# Patient Record
Sex: Female | Born: 1960 | Race: Black or African American | Hispanic: No | Marital: Married | Smoking: Never smoker
Health system: Southern US, Community
[De-identification: ages and names within clinical notes are randomized; demographics above are authoritative.]

## PROBLEM LIST (undated history)

## (undated) DIAGNOSIS — Q6119 Other polycystic kidney, infantile type: Secondary | ICD-10-CM

## (undated) HISTORY — DX: Other polycystic kidney, infantile type: Q61.19

---

## 2003-09-13 ENCOUNTER — Other Ambulatory Visit: Admission: RE | Admit: 2003-09-13 | Discharge: 2003-09-13 | Payer: Self-pay | Admitting: Obstetrics and Gynecology

## 2003-09-29 ENCOUNTER — Ambulatory Visit (HOSPITAL_COMMUNITY): Admission: RE | Admit: 2003-09-29 | Discharge: 2003-09-29 | Payer: Self-pay | Admitting: Obstetrics and Gynecology

## 2004-04-19 ENCOUNTER — Ambulatory Visit (HOSPITAL_COMMUNITY): Admission: RE | Admit: 2004-04-19 | Discharge: 2004-04-19 | Payer: Self-pay | Admitting: Obstetrics and Gynecology

## 2004-09-13 ENCOUNTER — Other Ambulatory Visit: Admission: RE | Admit: 2004-09-13 | Discharge: 2004-09-13 | Payer: Self-pay | Admitting: Obstetrics and Gynecology

## 2005-09-16 ENCOUNTER — Other Ambulatory Visit: Admission: RE | Admit: 2005-09-16 | Discharge: 2005-09-16 | Payer: Self-pay | Admitting: Obstetrics and Gynecology

## 2008-06-01 ENCOUNTER — Emergency Department (HOSPITAL_BASED_OUTPATIENT_CLINIC_OR_DEPARTMENT_OTHER): Admission: EM | Admit: 2008-06-01 | Discharge: 2008-06-01 | Payer: Self-pay | Admitting: Emergency Medicine

## 2010-05-20 ENCOUNTER — Ambulatory Visit (HOSPITAL_BASED_OUTPATIENT_CLINIC_OR_DEPARTMENT_OTHER): Admission: RE | Admit: 2010-05-20 | Discharge: 2010-05-21 | Payer: Self-pay | Admitting: Specialist

## 2010-11-12 ENCOUNTER — Other Ambulatory Visit: Payer: Self-pay | Admitting: Obstetrics and Gynecology

## 2011-01-11 LAB — BASIC METABOLIC PANEL
BUN: 14 mg/dL (ref 6–23)
CO2: 29 mEq/L (ref 19–32)
Calcium: 9.2 mg/dL (ref 8.4–10.5)
Chloride: 101 mEq/L (ref 96–112)
Creatinine, Ser: 0.92 mg/dL (ref 0.4–1.2)
GFR calc Af Amer: >60 mL/min (ref >60)
GFR calc non Af Amer: >60 mL/min (ref >60)
Glucose, Bld: 88 mg/dL (ref 70–99)
Potassium: 4.4 mEq/L (ref 3.5–5.1)
Sodium: 135 mEq/L (ref 135–145)

## 2011-01-11 LAB — DIFFERENTIAL
Basophils Absolute: 0 10*3/uL (ref 0.0–0.1)
Basophils Relative: 0 % (ref 0–1)
Eosinophils Absolute: 0 10*3/uL (ref 0.0–0.7)
Eosinophils Relative: 1 % (ref 0–5)
Lymphocytes Relative: 37 % (ref 12–46)
Lymphs Abs: 1.6 10*3/uL (ref 0.7–4.0)
Monocytes Absolute: 0.3 10*3/uL (ref 0.1–1.0)
Monocytes Relative: 7 % (ref 3–12)
Neutro Abs: 2.3 10*3/uL (ref 1.7–7.7)
Neutrophils Relative %: 54 % (ref 43–77)

## 2011-01-11 LAB — CBC
HCT: 34.8 % — ABNORMAL LOW (ref 36.0–46.0)
Hemoglobin: 11.9 g/dL — ABNORMAL LOW (ref 12.0–15.0)
MCH: 30.4 pg (ref 26.0–34.0)
MCHC: 34.2 g/dL (ref 30.0–36.0)
MCV: 88.9 fL (ref 78.0–100.0)
Platelets: 175 10*3/uL (ref 150–400)
RBC: 3.91 MIL/uL (ref 3.87–5.11)
RDW: 13.3 % (ref 11.5–15.5)
WBC: 4.3 10*3/uL (ref 4.0–10.5)

## 2011-01-11 LAB — POCT HEMOGLOBIN-HEMACUE: Hemoglobin: 16.8 g/dL — ABNORMAL HIGH (ref 12.0–15.0)

## 2011-11-19 ENCOUNTER — Other Ambulatory Visit: Payer: Self-pay | Admitting: Obstetrics and Gynecology

## 2011-11-25 ENCOUNTER — Other Ambulatory Visit: Payer: Self-pay | Admitting: Obstetrics and Gynecology

## 2011-11-25 DIAGNOSIS — R928 Other abnormal and inconclusive findings on diagnostic imaging of breast: Secondary | ICD-10-CM

## 2011-12-02 ENCOUNTER — Ambulatory Visit
Admission: RE | Admit: 2011-12-02 | Discharge: 2011-12-02 | Disposition: A | Discharge status: Home / Self Care | Payer: Private Health Insurance - Indemnity | Source: Ambulatory Visit | Attending: Obstetrics and Gynecology | Admitting: Obstetrics and Gynecology

## 2011-12-02 DIAGNOSIS — R928 Other abnormal and inconclusive findings on diagnostic imaging of breast: Secondary | ICD-10-CM

## 2012-12-08 ENCOUNTER — Other Ambulatory Visit (HOSPITAL_COMMUNITY): Payer: Self-pay | Admitting: Obstetrics and Gynecology

## 2012-12-08 DIAGNOSIS — R944 Abnormal results of kidney function studies: Secondary | ICD-10-CM

## 2012-12-08 DIAGNOSIS — Z8271 Family history of polycystic kidney: Secondary | ICD-10-CM

## 2012-12-13 ENCOUNTER — Ambulatory Visit (HOSPITAL_COMMUNITY)
Admission: RE | Admit: 2012-12-13 | Discharge: 2012-12-13 | Disposition: A | Discharge status: Home / Self Care | Payer: Private Health Insurance - Indemnity | Source: Ambulatory Visit | Attending: Obstetrics and Gynecology | Admitting: Obstetrics and Gynecology

## 2012-12-13 DIAGNOSIS — Q619 Cystic kidney disease, unspecified: Secondary | ICD-10-CM | POA: Insufficient documentation

## 2012-12-13 DIAGNOSIS — Z8271 Family history of polycystic kidney: Secondary | ICD-10-CM | POA: Insufficient documentation

## 2012-12-13 DIAGNOSIS — R944 Abnormal results of kidney function studies: Secondary | ICD-10-CM | POA: Insufficient documentation

## 2012-12-13 DIAGNOSIS — K7689 Other specified diseases of liver: Secondary | ICD-10-CM | POA: Insufficient documentation

## 2012-12-15 ENCOUNTER — Ambulatory Visit (HOSPITAL_COMMUNITY): Payer: Private Health Insurance - Indemnity

## 2012-12-17 IMAGING — MG MM DIGITAL DIAGNOSTIC UNILAT*L*
2 series · 2 of 2 positions shown · non-contrast
Comparison: [DATE] [DATE], [DATE], [DATE] [DATE], [DATE]

CLINICAL DATA: Called back from screening mammogram for possible
mass left breast

DIGITAL DIAGNOSTIC LEFT MAMMOGRAM WITH CAD

[L MLO]
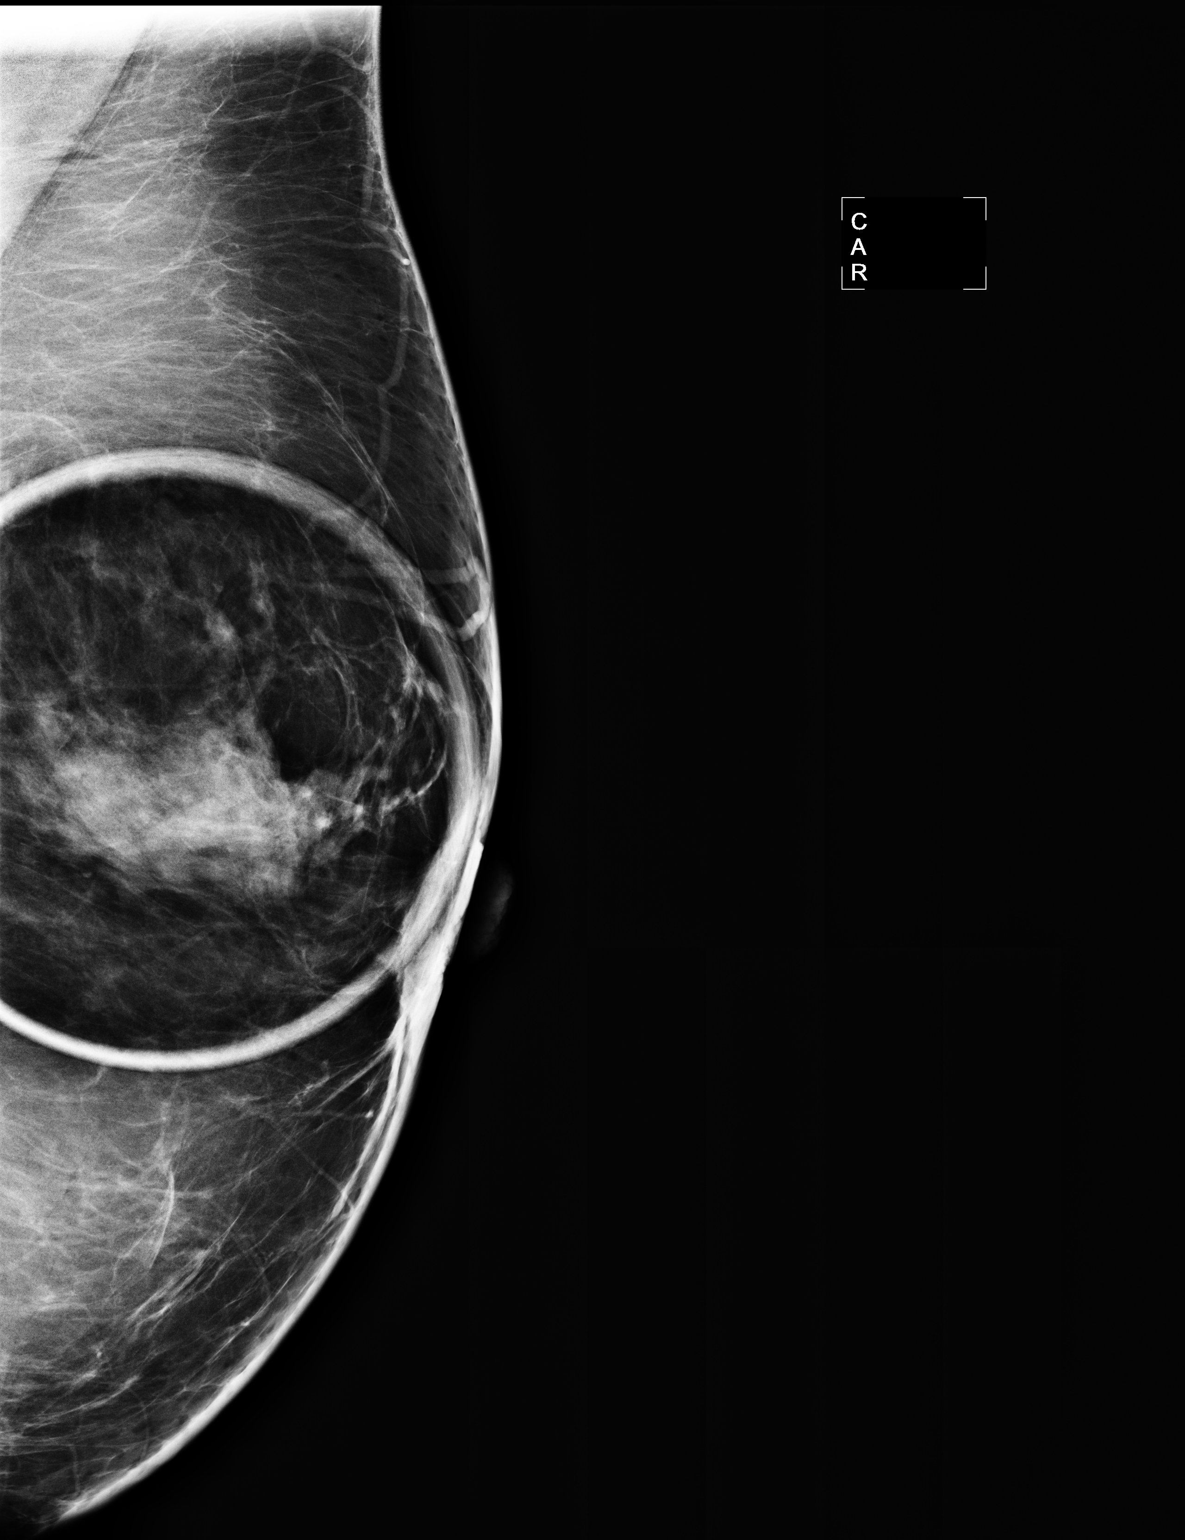

[L ML]
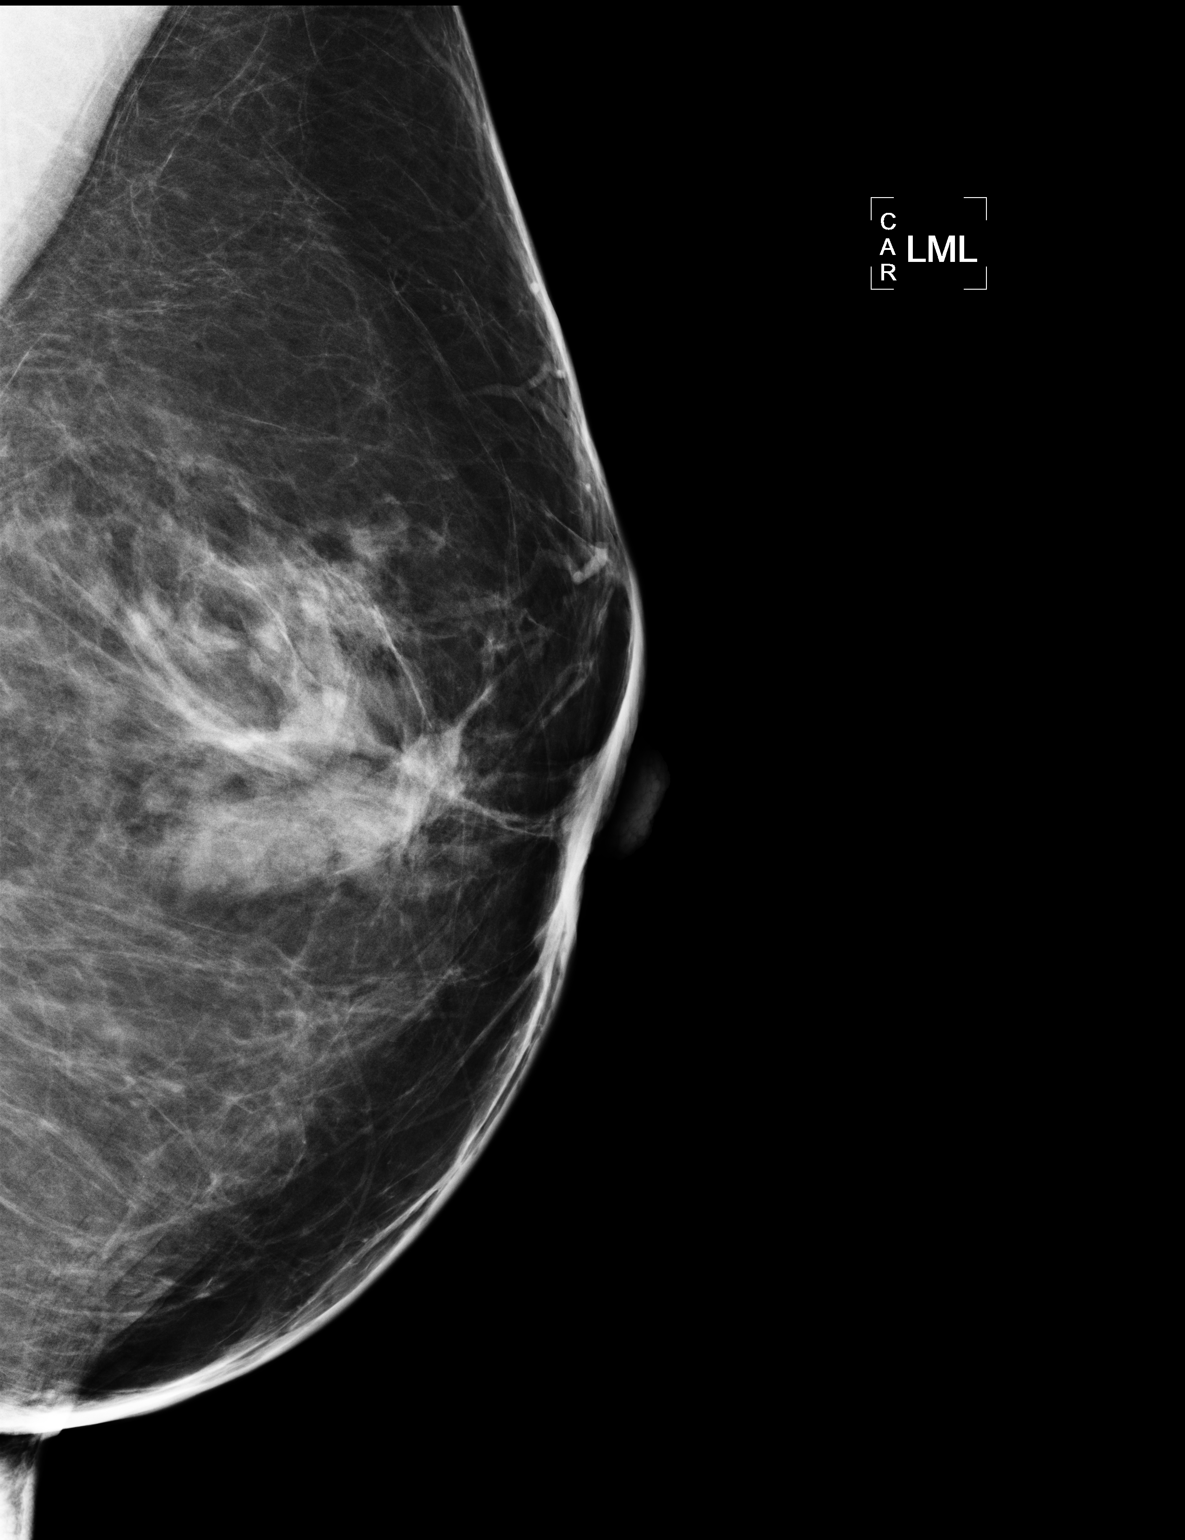

[2 of 2 positions shown; findings below may reference images not displayed]

FINDINGS: Lateral view left breast and spot compression left MLO
view are submitted.  Previously questioned asymmetry does not
persist on additional views.  There post reduction changes in the
left breast.
Mammographic images were processed with CAD.
IMPRESSION: Benign findings, recommend routine screening mammogram back on
schedule.

BI-RADS CATEGORY 2:  Benign finding(s).

## 2013-12-16 ENCOUNTER — Other Ambulatory Visit: Payer: Self-pay | Admitting: Obstetrics and Gynecology

## 2013-12-29 IMAGING — US US RENAL
1 series · 14 of 25 positions shown · non-contrast
Comparison: None

CLINICAL DATA: The patient's sister and mother have polycystic
kidney disease and elevated creatinine.

RENAL/URINARY TRACT ULTRASOUND COMPLETE

[Series 1: us renal · 14 of 50 slices shown]
[im 1/50]
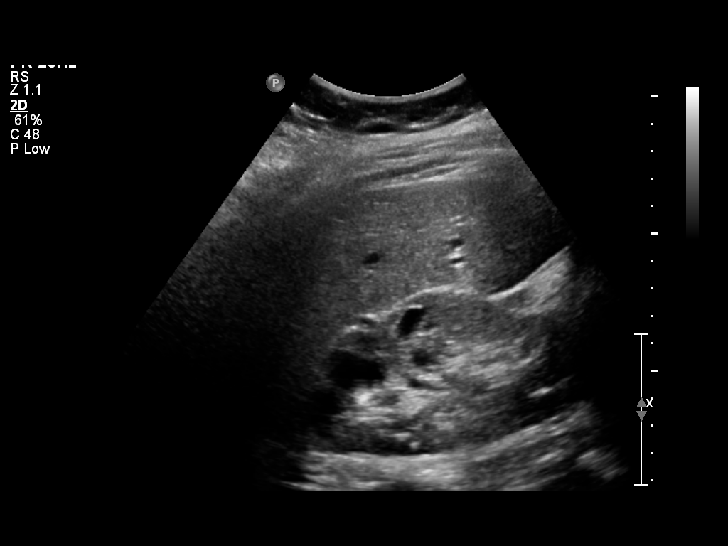
[im 5/50]
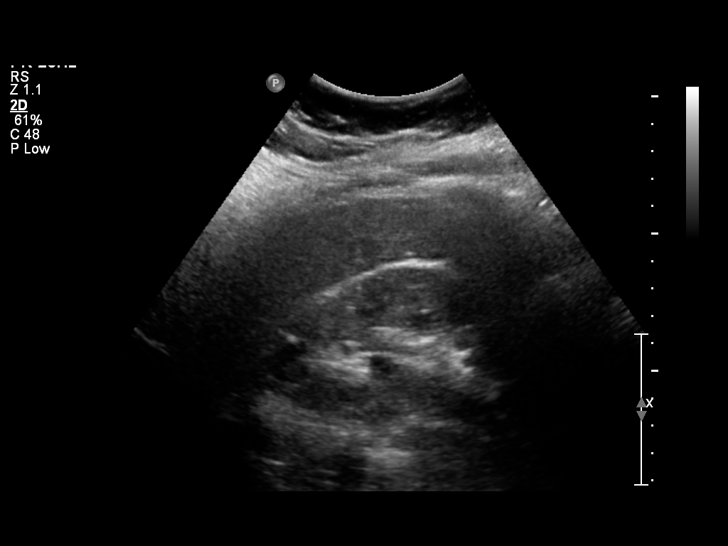
[im 9/50]
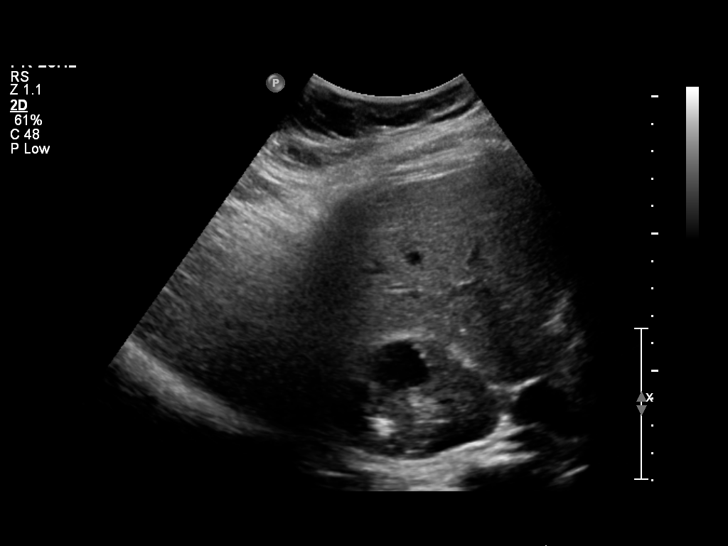
[im 13/50]
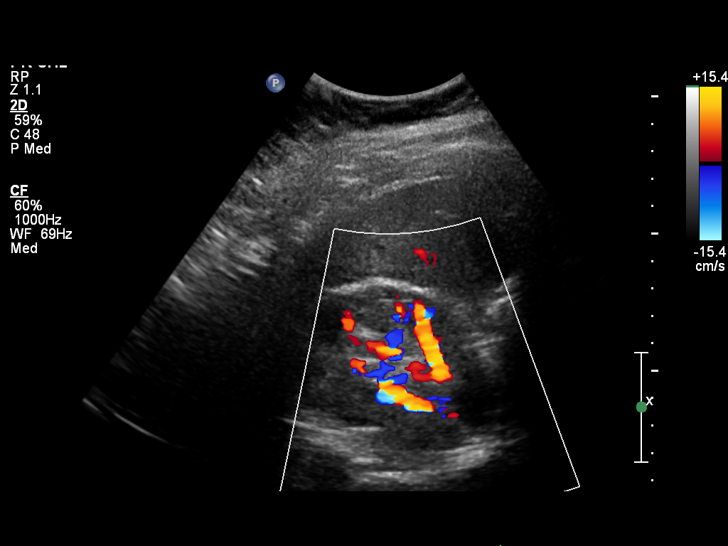
[im 17/50]
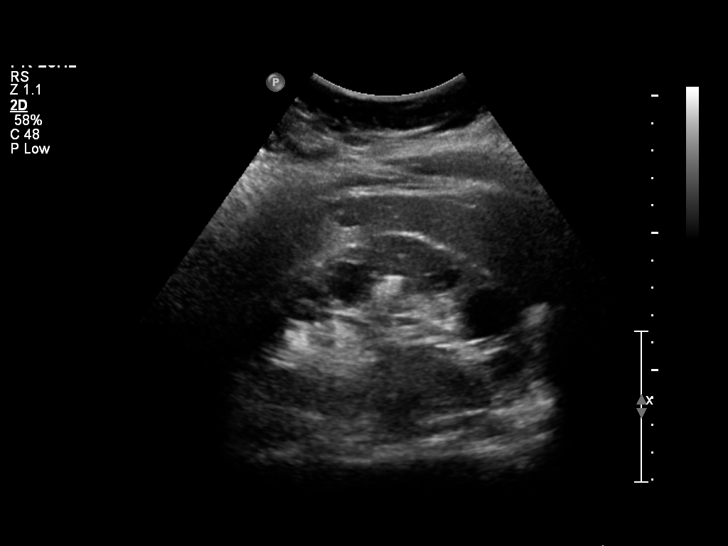
[im 19/50]
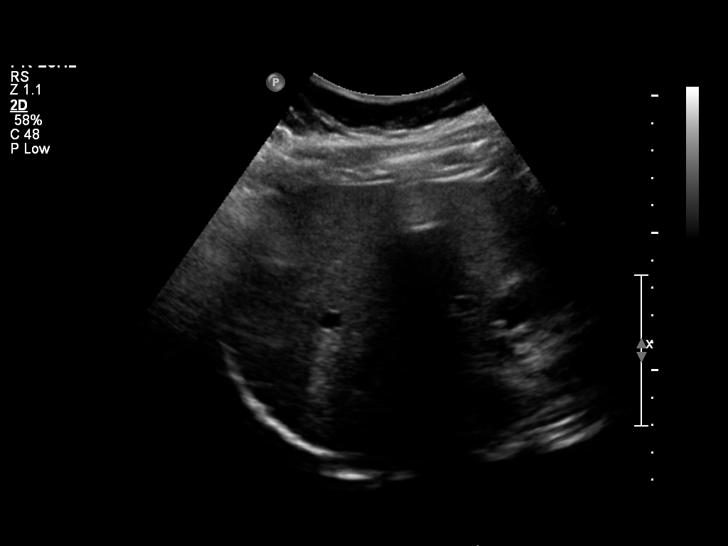
[im 23/50]
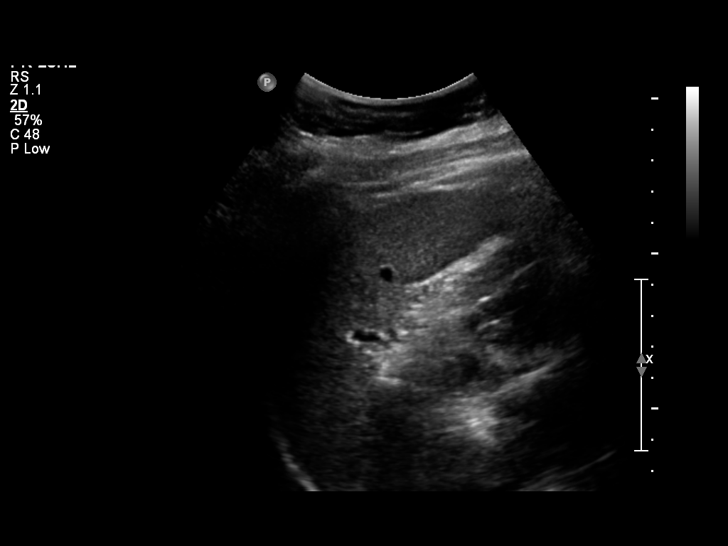
[im 27/50]
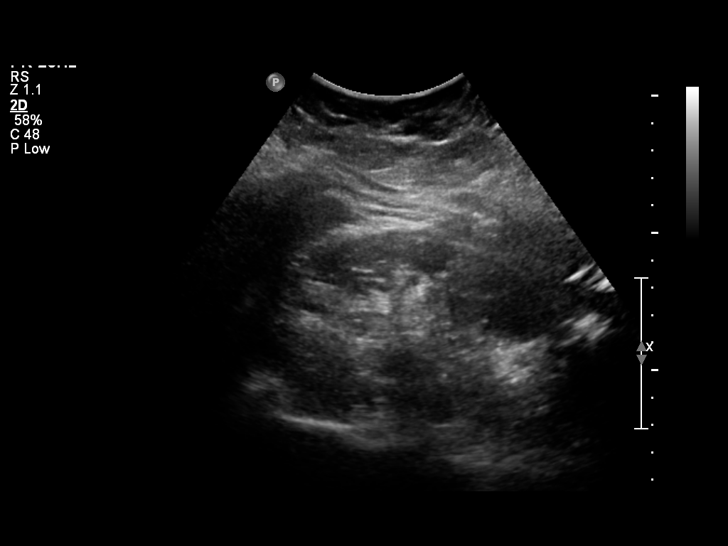
[im 31/50]
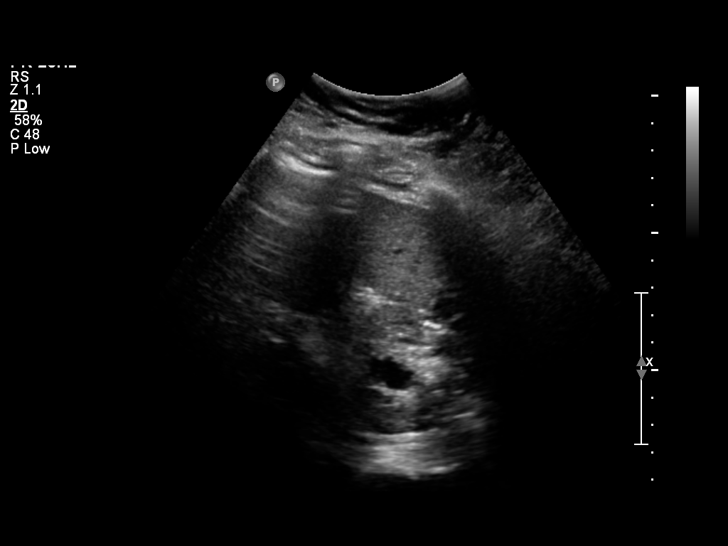
[im 33/50]
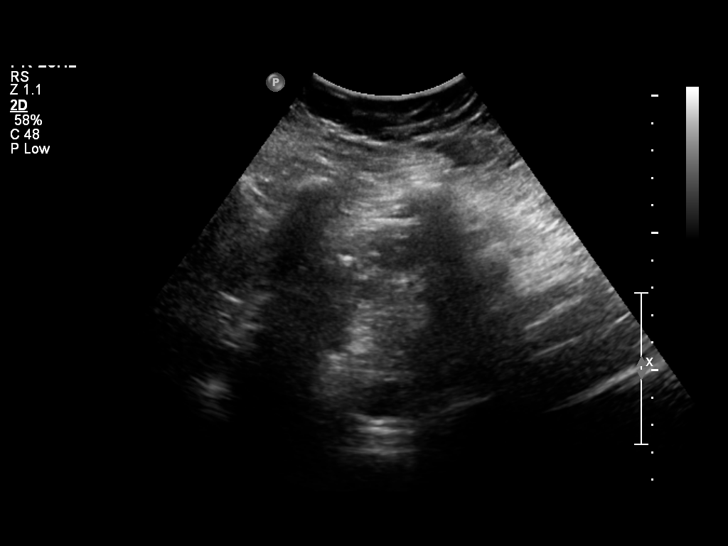
[im 37/50]
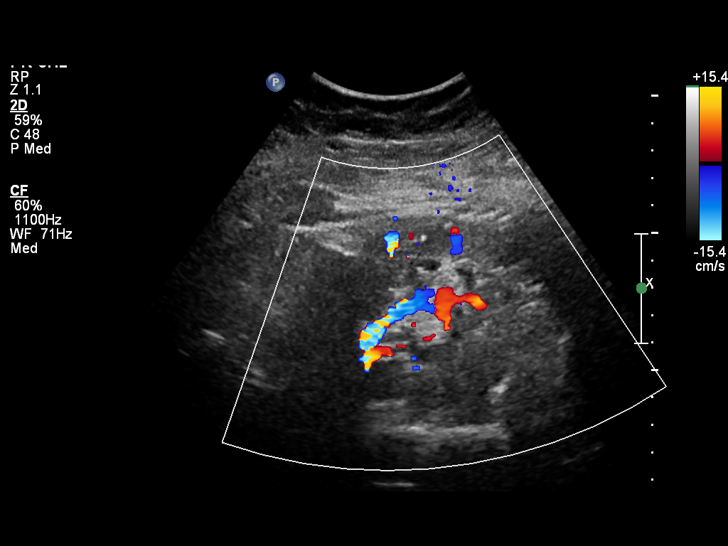
[im 41/50]
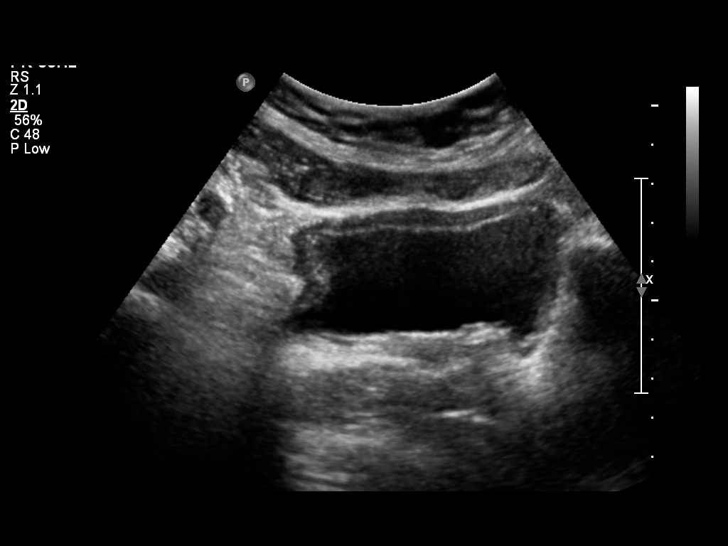
[im 45/50]
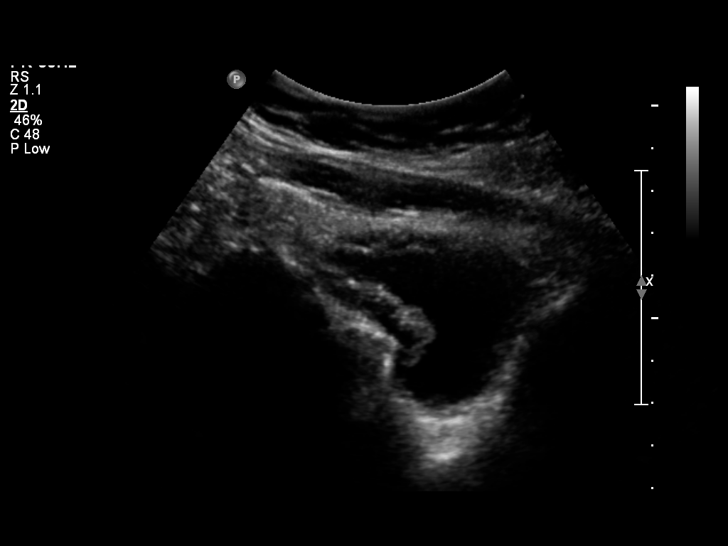
[im 50/50]
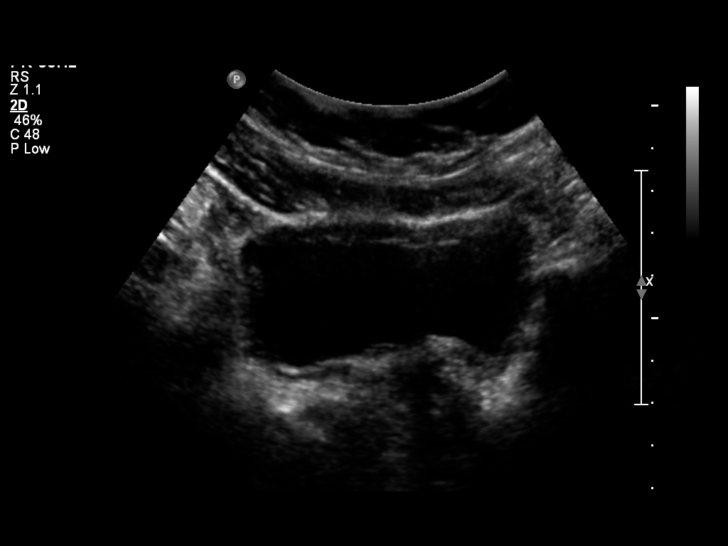

[14 of 25 positions shown; findings below may reference images not displayed]

FINDINGS: Right Kidney:  12.9 cm in length.  Multiple cysts are present.  The
largest measures 2.5 x 2.4 x 2.3 cm.  No hydronephrosis or solid
mass identified.

Left Kidney:  13.1 cm in length.  No hydronephrosis.  Multiple
cysts are present.  The largest is 2.8 x 2.4 x 2.9 cm.  No solid
mass identified.

Bladder:  Bilateral ureteral jets are identified.  There is
possible bladder wall thickening versus under distension of the
urinary bladder.

Additional findings: One or two liver cysts are also incidentally
noted.
IMPRESSION: 1.
1.  Multiple bilateral simple renal cysts, consistent with
autosomal dominant polycystic kidney disease.
2.  Question of bladder wall thickening versus under distension.
3.  Incidental note of small liver cysts.

## 2016-07-22 DIAGNOSIS — Q613 Polycystic kidney, unspecified: Secondary | ICD-10-CM | POA: Insufficient documentation

## 2016-07-22 DIAGNOSIS — E559 Vitamin D deficiency, unspecified: Secondary | ICD-10-CM | POA: Insufficient documentation

## 2017-01-12 DIAGNOSIS — E669 Obesity, unspecified: Secondary | ICD-10-CM | POA: Insufficient documentation

## 2018-07-29 DIAGNOSIS — M25473 Effusion, unspecified ankle: Secondary | ICD-10-CM | POA: Insufficient documentation

## 2018-07-29 DIAGNOSIS — N183 Chronic kidney disease, stage 3 unspecified: Secondary | ICD-10-CM | POA: Insufficient documentation

## 2018-07-29 DIAGNOSIS — D631 Anemia in chronic kidney disease: Secondary | ICD-10-CM | POA: Insufficient documentation

## 2019-08-02 DIAGNOSIS — M898X9 Other specified disorders of bone, unspecified site: Secondary | ICD-10-CM | POA: Insufficient documentation

## 2020-04-25 ENCOUNTER — Other Ambulatory Visit: Payer: Self-pay

## 2020-04-26 ENCOUNTER — Ambulatory Visit (INDEPENDENT_AMBULATORY_CARE_PROVIDER_SITE_OTHER): Payer: No Typology Code available for payment source | Admitting: Nurse Practitioner

## 2020-04-26 ENCOUNTER — Encounter: Payer: Self-pay | Admitting: Nurse Practitioner

## 2020-04-26 VITALS — BP 128/80 | HR 65 | Temp 97.5°F | Ht 65.75 in | Wt 223.6 lb

## 2020-04-26 DIAGNOSIS — Z6836 Body mass index (BMI) 36.0-36.9, adult: Secondary | ICD-10-CM

## 2020-04-26 DIAGNOSIS — I10 Essential (primary) hypertension: Secondary | ICD-10-CM

## 2020-04-26 DIAGNOSIS — Z0000 Encounter for general adult medical examination without abnormal findings: Secondary | ICD-10-CM

## 2020-04-26 DIAGNOSIS — Z136 Encounter for screening for cardiovascular disorders: Secondary | ICD-10-CM

## 2020-04-26 DIAGNOSIS — Z1159 Encounter for screening for other viral diseases: Secondary | ICD-10-CM | POA: Diagnosis not present

## 2020-04-26 DIAGNOSIS — R7303 Prediabetes: Secondary | ICD-10-CM | POA: Insufficient documentation

## 2020-04-26 DIAGNOSIS — Z13220 Encounter for screening for lipoid disorders: Secondary | ICD-10-CM

## 2020-04-26 LAB — CBC WITH DIFFERENTIAL/PLATELET
Basophils Absolute: 0 10*3/uL (ref 0.0–0.1)
Basophils Relative: 0.5 % (ref 0.0–3.0)
Eosinophils Absolute: 0.1 10*3/uL (ref 0.0–0.7)
Eosinophils Relative: 1.9 % (ref 0.0–5.0)
HCT: 34.3 % — ABNORMAL LOW (ref 36.0–46.0)
Hemoglobin: 11.5 g/dL — ABNORMAL LOW (ref 12.0–15.0)
Lymphocytes Relative: 37.5 % (ref 12.0–46.0)
Lymphs Abs: 1.4 10*3/uL (ref 0.7–4.0)
MCHC: 33.5 g/dL (ref 30.0–36.0)
MCV: 86.4 fl (ref 78.0–100.0)
Monocytes Absolute: 0.2 10*3/uL (ref 0.1–1.0)
Monocytes Relative: 6.2 % (ref 3.0–12.0)
Neutro Abs: 2 10*3/uL (ref 1.4–7.7)
Neutrophils Relative %: 53.9 % (ref 43.0–77.0)
Platelets: 194 10*3/uL (ref 150.0–400.0)
RBC: 3.97 Mil/uL (ref 3.87–5.11)
RDW: 13.7 % (ref 11.5–15.5)
WBC: 3.6 10*3/uL — ABNORMAL LOW (ref 4.0–10.5)

## 2020-04-26 LAB — HEPATIC FUNCTION PANEL
ALT: 11 U/L (ref 0–35)
AST: 13 U/L (ref 0–37)
Albumin: 4.1 g/dL (ref 3.5–5.2)
Alkaline Phosphatase: 64 U/L (ref 39–117)
Bilirubin, Direct: 0.1 mg/dL (ref 0.0–0.3)
Total Bilirubin: 0.5 mg/dL (ref 0.2–1.2)
Total Protein: 7 g/dL (ref 6.0–8.3)

## 2020-04-26 LAB — LIPID PANEL
Cholesterol: 202 mg/dL — ABNORMAL HIGH (ref 0–200)
HDL: 52.1 mg/dL (ref >39.00)
LDL Cholesterol: 116 mg/dL — ABNORMAL HIGH (ref 0–99)
NonHDL: 149.91
Total CHOL/HDL Ratio: 4
Triglycerides: 172 mg/dL — ABNORMAL HIGH (ref 0.0–149.0)
VLDL: 34.4 mg/dL (ref 0.0–40.0)

## 2020-04-26 LAB — BASIC METABOLIC PANEL
BUN: 21 mg/dL (ref 6–23)
CO2: 27 mEq/L (ref 19–32)
Calcium: 9.3 mg/dL (ref 8.4–10.5)
Chloride: 106 mEq/L (ref 96–112)
Creatinine, Ser: 1.35 mg/dL — ABNORMAL HIGH (ref 0.40–1.20)
GFR: 48.96 mL/min — ABNORMAL LOW (ref >60.00)
Glucose, Bld: 95 mg/dL (ref 70–99)
Potassium: 4.3 mEq/L (ref 3.5–5.1)
Sodium: 140 mEq/L (ref 135–145)

## 2020-04-26 LAB — TSH: TSH: 1.1 u[IU]/mL (ref 0.35–4.50)

## 2020-04-26 NOTE — Assessment & Plan Note (Signed)
HgbA1c of 5.7 per [facility]  Lab results

## 2020-04-26 NOTE — Progress Notes (Signed)
Subjective:    Patient ID: Debbie Wu, female    DOB: Nov 29, 1960, 59 y.o.   MRN: [id number]  Patient presents today for complete physical and establish care.  HPI  Hx of PCOS, vit.D deficiency, metabolic bone disease and anemia due to CKD: Managed by Dr. Orpah Greek with [facility] Last OV 01/2020 Reviewed labs results: CBC, renal function PTH, vit. D, and HgbA1c  HTN: BP at goal with losartan Reports chronic LE edema, managed with use of furosemide prn Admits to diet indiscretion. BP Readings from Last 3 Encounters:  04/26/20 128/80   Sexual History (orientation,birth control, marital status, STD):married, sexually active, up to date with PAP and breast exam. Mammogram completed by [employer] [facility] [facility]. PAP completed by [facility]  Depression/Suicide: Depression screen [facility] 2/9 04/26/2020  Decreased Interest 0  Down, Depressed, Hopeless 0  PHQ - 2 Score 0   Vision:will schedule  Dental:up to date  Immunizations: (TDAP, Hep C screen, Pneumovax, Influenza, zoster): reports colonoscopy completed 42yrs ago: normal results per patient  Health Maintenance  Topic Date Due    Hepatitis C: One time screening is recommended by Center for Disease Control  (CDC) for  adults born from [birth year] through 1963.   Never done   COVID-19 Vaccine (1) Never done   HIV Screening  Never done   Tetanus Vaccine  Never done   Colon Cancer Screening  Never done   Mammogram  12/01/2013   Pap Smear  12/16/2016   Flu Shot  05/27/2020   Diet:regular.  Weight:  Wt Readings from Last 3 Encounters:  04/26/20 223 lb 9.6 oz (101.4 kg)    Exercise:walking and spin class 2-3x/week  Fall Risk: Fall Risk  04/26/2020  Falls in the past year? 0  Number falls in past yr: 0  Injury with Fall? 0   Medications and allergies reviewed with patient and updated if appropriate.  Patient Active Problem List   Diagnosis Date Noted   Essential hypertension 04/26/2020   Prediabetes 04/26/2020   Metabolic  bone disease 08/02/2019   Anemia due to stage 3 chronic kidney disease 07/29/2018   Ankle swelling 07/29/2018   Obesity 01/12/2017   PKD (polycystic kidney disease) 07/22/2016   Vitamin D deficiency 07/22/2016    Current Outpatient Medications on File Prior to Visit  Medication Sig Dispense Refill   ferrous sulfate 325 (65 FE) MG EC tablet Take 1 tablet by mouth every morning.     furosemide (LASIX) 20 MG tablet Take 20 mg by mouth daily as needed.     losartan (COZAAR) 50 MG tablet Take 50 mg by mouth 2 (two) times daily.     Vitamin D, Ergocalciferol, (DRISDOL) 1.25 MG (50000 UNIT) CAPS capsule Take 50,000 Units by mouth once a week.     No current facility-administered medications on file prior to visit.    Past Medical History:  Diagnosis Date   Autosomal recessive polycystic kidneys     History reviewed. No pertinent surgical history.  Social History   Socioeconomic History   Marital status: Married    Spouse name: Not on file   Number of children: Not on file   Years of education: Not on file   Highest education level: Not on file  Occupational History   Not on file  Tobacco Use   Smoking status: Never Smoker   Smokeless tobacco: Never Used  Substance and Sexual Activity   Alcohol use: Yes    Comment: socially   Drug use: Never  Sexual activity: Not on file  Other Topics Concern   Not on file  Social History Narrative   Not on file   Social Determinants of Health   Financial Resource Strain:    Difficulty of Paying Living Expenses:   Food Insecurity:    Worried About Running Out of Food in the Last Year:    Barista in the Last Year:   Transportation Needs:    Freight forwarder (Medical):    Lack of Transportation (Non-Medical):   Physical Activity:    Days of Exercise per Week:    Minutes of Exercise per Session:   Stress:    Feeling of Stress :   Social Connections:    Frequency of Communication with  Friends and Family:    Frequency of Social Gatherings with Friends and Family:    Attends Religious Services:    Active Member of Clubs or Organizations:    Attends Engineer, structural:    Marital Status:     Family History  Problem Relation Age of Onset   Kidney disease Mother    Cancer Father    Kidney disease Sister         Review of Systems  Constitutional: Negative for fever, malaise/fatigue and weight loss.  HENT: Negative for congestion and sore throat.   Eyes:       Negative for visual changes  Respiratory: Negative for cough and shortness of breath.   Cardiovascular: Negative for chest pain, palpitations and leg swelling.  Gastrointestinal: Negative for blood in stool, constipation, diarrhea and heartburn.  Genitourinary: Negative for dysuria, frequency and urgency.  Musculoskeletal: Negative for falls, joint pain and myalgias.  Skin: Negative for rash.  Neurological: Negative for dizziness, sensory change and headaches.  Endo/Heme/Allergies: Does not bruise/bleed easily.  Psychiatric/Behavioral: Negative for depression, substance abuse and suicidal ideas. The patient is not nervous/anxious.     Objective:   Vitals:   04/26/20 0944  BP: 128/80  Pulse: 65  Temp: (!) 97.5 F (36.4 C)  SpO2: 97%    Body mass index is 36.37 kg/m.   Physical Examination:  Physical Exam Vitals reviewed.  Constitutional:      General: She is not in acute distress.    Appearance: She is well-developed. She is obese.  HENT:     Right Ear: Tympanic membrane, ear canal and external ear normal.     Left Ear: Tympanic membrane, ear canal and external ear normal.  Eyes:     Extraocular Movements: Extraocular movements intact.     Conjunctiva/sclera: Conjunctivae normal.  Neck:     Thyroid: No thyroid mass, thyromegaly or thyroid tenderness.  Cardiovascular:     Rate and Rhythm: Normal rate and regular rhythm.     Pulses: Normal pulses.     Heart sounds:  Normal heart sounds.  Pulmonary:     Effort: Pulmonary effort is normal. No respiratory distress.     Breath sounds: Normal breath sounds.  Chest:     Chest wall: No tenderness.  Abdominal:     General: Bowel sounds are normal.     Palpations: Abdomen is soft.  Genitourinary:    Comments: Breast and pelvic exam deferred to GYN Musculoskeletal:        General: Normal range of motion.     Cervical back: Normal range of motion and neck supple.     Right lower leg: Edema present.     Left lower leg: Edema present.  Lymphadenopathy:  Cervical: No cervical adenopathy.  Skin:    General: Skin is warm and dry.  Neurological:     Mental Status: She is alert and oriented to person, place, and time.     Deep Tendon Reflexes: Reflexes are normal and symmetric.  Psychiatric:        Mood and Affect: Mood normal.        Behavior: Behavior normal.        Thought Content: Thought content normal.    ASSESSMENT and PLAN: This visit occurred during the SARS-CoV-2 public health emergency.  Safety protocols were in place, including screening questions prior to the visit, additional usage of staff PPE, and extensive cleaning of exam room while observing appropriate contact time as indicated for disinfecting solutions.   Debbie Wu was seen today for establish care.  Diagnoses and all orders for this visit:  Preventative health care -     Lipid panel -     Hepatic function panel -     TSH -     CBC w/Diff -     Hepatitis C Antibody  Encounter for lipid screening for cardiovascular disease -     Lipid panel  Essential hypertension -     Basic metabolic panel  Encounter for hepatitis C screening test for low risk patient -     Hepatitis C Antibody  Class 2 severe obesity due to excess calories with serious comorbidity and body mass index (BMI) of 36.0 to 36.9 in adult [facility])    Obesity She has committed to making changes to her diet, decrease portions and maintaining daily exercise. She  declined referral to weight loss clinic and nutritionist at this time. F/up in 31months   Prediabetes HgbA1c of 5.7 per [facility]  Lab results      Problem List Items Addressed This Visit      Cardiovascular and Mediastinum   Essential hypertension   Relevant Medications   furosemide (LASIX) 20 MG tablet   losartan (COZAAR) 50 MG tablet   Other Relevant Orders   Basic metabolic panel     Other   Obesity    She has committed to making changes to her diet, decrease portions and maintaining daily exercise. She declined referral to weight loss clinic and nutritionist at this time. F/up in 61months        Other Visit Diagnoses    Preventative health care    -  Primary   Relevant Orders   Lipid panel   Hepatic function panel   TSH   CBC w/Diff   Hepatitis C Antibody   Encounter for lipid screening for cardiovascular disease       Relevant Orders   Lipid panel   Encounter for hepatitis C screening test for low risk patient       Relevant Orders   Hepatitis C Antibody       Follow up: Return in about 6 months (around 10/27/2020) for Prediabetes.  Alysia Penna, NP

## 2020-04-26 NOTE — Patient Instructions (Signed)
Go to lab for blood draw Sign medical release to get previous mammogram, colonoscopy and PAP results.  Thank you for choosing [facility] for your health needs.   Health Maintenance, Female Adopting a healthy lifestyle and getting preventive care are important in promoting health and wellness. Ask your health care provider about:  The right schedule for you to have regular tests and exams.  Things you can do on your own to prevent diseases and keep yourself healthy. What should I know about diet, weight, and exercise? Eat a healthy diet   Eat a diet that includes plenty of vegetables, fruits, low-fat dairy products, and lean protein.  Do not eat a lot of foods that are high in solid fats, added sugars, or sodium. Maintain a healthy weight Body mass index (BMI) is used to identify weight problems. It estimates body fat based on height and weight. Your health care provider can help determine your BMI and help you achieve or maintain a healthy weight. Get regular exercise Get regular exercise. This is one of the most important things you can do for your health. Most adults should:  Exercise for at least 150 minutes each week. The exercise should increase your heart rate and make you sweat (moderate-intensity exercise).  Do strengthening exercises at least twice a week. This is in addition to the moderate-intensity exercise.  Spend less time sitting. Even light physical activity can be beneficial. Watch cholesterol and blood lipids Have your blood tested for lipids and cholesterol at 57 years of age, then have this test every 5 years. Have your cholesterol levels checked more often if:  Your lipid or cholesterol levels are high.  You are older than 57 years of age.  You are at high risk for heart disease. What should I know about cancer screening? Depending on your health history and family history, you may need to have cancer screening at various ages. This may include  screening for:  Breast cancer.  Cervical cancer.  Colorectal cancer.  Skin cancer.  Lung cancer. What should I know about heart disease, diabetes, and high blood pressure? Blood pressure and heart disease  High blood pressure causes heart disease and increases the risk of stroke. This is more likely to develop in people who have high blood pressure readings, are of African descent, or are overweight.  Have your blood pressure checked: ? Every 3-5 years if you are 63-3 years of age. ? Every year if you are 9 years old or older. Diabetes Have regular diabetes screenings. This checks your fasting blood sugar level. Have the screening done:  Once every three years after age 9 if you are at a normal weight and have a low risk for diabetes.  More often and at a younger age if you are overweight or have a high risk for diabetes. What should I know about preventing infection? Hepatitis B If you have a higher risk for hepatitis B, you should be screened for this virus. Talk with your health care provider to find out if you are at risk for hepatitis B infection. Hepatitis C Testing is recommended for:  Everyone born from [birth year] through 1963.  Anyone with known risk factors for hepatitis C. Sexually transmitted infections (STIs)  Get screened for STIs, including gonorrhea and chlamydia, if: ? You are sexually active and are younger than 57 years of age. ? You are older than 57 years of age and your health care provider tells you that you are at risk for  this type of infection. ? Your sexual activity has changed since you were last screened, and you are at increased risk for chlamydia or gonorrhea. Ask your health care provider if you are at risk.  Ask your health care provider about whether you are at high risk for HIV. Your health care provider may recommend a prescription medicine to help prevent HIV infection. If you choose to take medicine to prevent HIV, you should first get  tested for HIV. You should then be tested every 3 months for as long as you are taking the medicine. Pregnancy  If you are about to stop having your period (premenopausal) and you may become pregnant, seek counseling before you get pregnant.  Take 400 to 800 micrograms (mcg) of folic acid every day if you become pregnant.  Ask for birth control (contraception) if you want to prevent pregnancy. Osteoporosis and menopause Osteoporosis is a disease in which the bones lose minerals and strength with aging. This can result in bone fractures. If you are 62 years old or older, or if you are at risk for osteoporosis and fractures, ask your health care provider if you should:  Be screened for bone loss.  Take a calcium or vitamin D supplement to lower your risk of fractures.  Be given hormone replacement therapy (HRT) to treat symptoms of menopause. Follow these instructions at home: Lifestyle  Do not use any products that contain nicotine or tobacco, such as cigarettes, e-cigarettes, and chewing tobacco. If you need help quitting, ask your health care provider.  Do not use street drugs.  Do not share needles.  Ask your health care provider for help if you need support or information about quitting drugs. Alcohol use  Do not drink alcohol if: ? Your health care provider tells you not to drink. ? You are pregnant, may be pregnant, or are planning to become pregnant.  If you drink alcohol: ? Limit how much you use to 0-1 drink a day. ? Limit intake if you are breastfeeding.  Be aware of how much alcohol is in your drink. In the [place] one drink equals one 12 oz bottle of beer (355 mL), one 5 oz glass of wine (148 mL), or one 1 oz glass of hard liquor (44 mL). General instructions  Schedule regular health, dental, and eye exams.  Stay current with your vaccines.  Tell your health care provider if: ? You often feel depressed. ? You have ever been abused or do not feel safe at  home. Summary  Adopting a healthy lifestyle and getting preventive care are important in promoting health and wellness.  Follow your health care provider's instructions about healthy diet, exercising, and getting tested or screened for diseases.  Follow your health care provider's instructions on monitoring your cholesterol and blood pressure. This information is not intended to replace advice given to you by your health care provider. Make sure you discuss any questions you have with your health care provider. Document Revised: 10/06/2018 Document Reviewed: 10/06/2018 Elsevier Patient Education  2020 [employer].

## 2020-04-26 NOTE — Assessment & Plan Note (Signed)
She has committed to making changes to her diet, decrease portions and maintaining daily exercise. She declined referral to weight loss clinic and nutritionist at this time. F/up in 62months

## 2020-04-27 LAB — HEPATITIS C ANTIBODY
Hepatitis C Ab: NONREACTIVE
SIGNAL TO CUT-OFF: 0.03

## 2020-08-09 LAB — BASIC METABOLIC PANEL
BUN: 21 (ref 4–21)
CO2: 28 — AB (ref 13–22)
Chloride: 107 (ref 99–108)
Glucose: 91
Potassium: 4.6 (ref 3.4–5.3)
Sodium: 142 (ref 137–147)

## 2020-08-09 LAB — VITAMIN D 25 HYDROXY (VIT D DEFICIENCY, FRACTURES): Vit D, 25-Hydroxy: 37

## 2020-09-26 ENCOUNTER — Other Ambulatory Visit: Payer: Self-pay

## 2020-09-27 ENCOUNTER — Encounter: Payer: Self-pay | Admitting: Nurse Practitioner

## 2020-09-27 ENCOUNTER — Other Ambulatory Visit (HOSPITAL_COMMUNITY)
Admission: RE | Admit: 2020-09-27 | Discharge: 2020-09-27 | Disposition: A | Discharge status: Home / Self Care | Payer: No Typology Code available for payment source | Source: Ambulatory Visit | Attending: Nurse Practitioner | Admitting: Nurse Practitioner

## 2020-09-27 ENCOUNTER — Ambulatory Visit (INDEPENDENT_AMBULATORY_CARE_PROVIDER_SITE_OTHER): Payer: No Typology Code available for payment source | Admitting: Nurse Practitioner

## 2020-09-27 VITALS — BP 138/88 | HR 65 | Temp 96.3°F | Ht 64.5 in | Wt 225.0 lb

## 2020-09-27 DIAGNOSIS — N766 Ulceration of vulva: Secondary | ICD-10-CM | POA: Insufficient documentation

## 2020-09-27 DIAGNOSIS — Z124 Encounter for screening for malignant neoplasm of cervix: Secondary | ICD-10-CM | POA: Diagnosis not present

## 2020-09-27 NOTE — Progress Notes (Signed)
   Subjective:  Patient ID: Debbie Wu, female    DOB: 11-18-1960  Age: 59 y.o. MRN: [id number]  CC: Follow-up (Routine pap exam needed. Declines flu vaccine. )  HPI Ms. Whisonant is here for pelvic exam. She did not f/up with GYN as she previously stated. She has been menopausal for 63yrs. She denies need for any STD screen today.  Reviewed past Medical, Social and Family history today.  Outpatient Medications Prior to Visit  Medication Sig Dispense Refill  . ferrous sulfate 325 (65 FE) MG EC tablet Take 1 tablet by mouth every morning.    . furosemide (LASIX) 20 MG tablet Take 20 mg by mouth daily as needed.    Marland Kitchen losartan (COZAAR) 50 MG tablet Take 50 mg by mouth 2 (two) times daily.    . Tolvaptan (JYNARQUE) 45 & 15 MG TBPK Take by mouth.    . Vitamin D, Ergocalciferol, (DRISDOL) 1.25 MG (50000 UNIT) CAPS capsule Take 50,000 Units by mouth once a week.     No facility-administered medications prior to visit.    ROS See HPI  Objective:  BP 138/88 (BP Location: Left Arm, Patient Position: Sitting, Cuff Size: Large)   Pulse 65   Temp (!) 96.3 F (35.7 C) (Temporal)   Ht 5' 4.5" (1.638 m)   Wt 225 lb (102.1 kg)   SpO2 95%   BMI 38.02 kg/m   Physical Exam Vitals reviewed. Exam conducted with a chaperone present.  Abdominal:     Hernia: There is no hernia in the left inguinal area or right inguinal area.  Genitourinary:    Labia:        Right: No rash or tenderness.        Left: No rash or tenderness.      Vagina: Vaginal discharge present. No erythema, tenderness, bleeding or lesions.     Cervix: Discharge present. No cervical motion tenderness, friability or erythema.     Uterus: Normal.      Adnexa: Right adnexa normal and left adnexa normal.    Lymphadenopathy:     Lower Body: No right inguinal adenopathy. No left inguinal adenopathy.  Neurological:     Mental Status: She is alert and oriented to person, place, and time.    Assessment & Plan:  This visit  occurred during the SARS-CoV-2 public health emergency.  Safety protocols were in place, including screening questions prior to the visit, additional usage of staff PPE, and extensive cleaning of exam room while observing appropriate contact time as indicated for disinfecting solutions.   Forever was seen today for follow-up.  Diagnoses and all orders for this visit:  Encounter for Papanicolaou smear for cervical cancer screening -     Cytology - PAP( [place])  Genital labial ulcer   Problem List Items Addressed This Visit      Genitourinary   Genital labial ulcer    Shallow, erythematous base, Non painful. No known hx of HSV, no previous test. She declined any STD screen today.       Other Visit Diagnoses    Encounter for Papanicolaou smear for cervical cancer screening    -  Primary   Relevant Orders   Cytology - PAP( [place])      Follow-up: No follow-ups on file.  Alysia Penna, NP

## 2020-09-27 NOTE — Assessment & Plan Note (Addendum)
Shallow, erythematous base, Non painful. No known hx of HSV, no previous test. She declined any STD screen today.

## 2020-09-27 NOTE — Patient Instructions (Signed)
You will be contacted with results 

## 2020-10-04 LAB — CYTOLOGY - PAP
Comment: NEGATIVE
Comment: NEGATIVE
Diagnosis: NEGATIVE
HSV1: NEGATIVE
HSV2: NEGATIVE
High risk HPV: NEGATIVE

## 2020-11-27 DIAGNOSIS — E872 Acidosis, unspecified: Secondary | ICD-10-CM | POA: Insufficient documentation

## 2021-06-17 ENCOUNTER — Other Ambulatory Visit: Payer: Self-pay | Admitting: Nurse Practitioner

## 2021-07-16 LAB — HM MAMMOGRAPHY

## 2021-07-24 ENCOUNTER — Encounter: Payer: Self-pay | Admitting: Nurse Practitioner

## 2022-08-21 LAB — HM MAMMOGRAPHY

## 2022-12-12 ENCOUNTER — Telehealth: Payer: Self-pay | Admitting: Nurse Practitioner

## 2022-12-12 NOTE — Telephone Encounter (Signed)
Lvm to see if patient still wants to see Debbie Wu as PCP or if we need to update profile

## 2023-11-16 ENCOUNTER — Emergency Department (HOSPITAL_BASED_OUTPATIENT_CLINIC_OR_DEPARTMENT_OTHER): Payer: No Typology Code available for payment source

## 2023-11-16 ENCOUNTER — Encounter (HOSPITAL_BASED_OUTPATIENT_CLINIC_OR_DEPARTMENT_OTHER): Payer: Self-pay

## 2023-11-16 ENCOUNTER — Other Ambulatory Visit (HOSPITAL_BASED_OUTPATIENT_CLINIC_OR_DEPARTMENT_OTHER): Payer: Self-pay

## 2023-11-16 ENCOUNTER — Emergency Department (HOSPITAL_BASED_OUTPATIENT_CLINIC_OR_DEPARTMENT_OTHER)
Admission: EM | Admit: 2023-11-16 | Discharge: 2023-11-16 | Disposition: A | Discharge status: Home / Self Care | Payer: No Typology Code available for payment source

## 2023-11-16 ENCOUNTER — Other Ambulatory Visit: Payer: Self-pay

## 2023-11-16 DIAGNOSIS — M79672 Pain in left foot: Secondary | ICD-10-CM | POA: Insufficient documentation

## 2023-11-16 DIAGNOSIS — Z79899 Other long term (current) drug therapy: Secondary | ICD-10-CM | POA: Insufficient documentation

## 2023-11-16 DIAGNOSIS — N189 Chronic kidney disease, unspecified: Secondary | ICD-10-CM | POA: Diagnosis not present

## 2023-11-16 MED ORDER — OXYCODONE HCL 5 MG PO TABS
5.0000 mg | ORAL_TABLET | Freq: Four times a day (QID) | ORAL | 0 refills | Status: AC | PRN
  Filled 2023-11-16: qty 5, 2d supply, fill #0

## 2023-11-16 MED ORDER — PREDNISONE 20 MG PO TABS
ORAL_TABLET | ORAL | 0 refills | Status: AC
  Filled 2023-11-16: qty 20, 12d supply, fill #0

## 2023-11-16 NOTE — ED Triage Notes (Addendum)
Patient here POV from Home.  Endorses Left Sided Foot and Posterior Lower Leg pain consistent and constant for 3 days. No Known Trauma. Pulse palpable. Swelling noted distally.  NAD noted during Triage. A&Ox4. GCS 15. BIB Wheelchair.

## 2023-11-16 NOTE — ED Provider Notes (Signed)
[place] [facility] AT [facility] Provider Note   CSN: [id number] Arrival date & time: 11/16/23  0848     History  Chief Complaint  Patient presents with   Leg Pain    Debbie Wu is a 63 y.o. female.  Patient with history of chronic kidney disease due to polycystic kidney (baseline crt 1.5-1.7) --presents to the emergency department for evaluation of left foot pain and swelling.  Symptoms started 3 days ago.  It has been persistent.  No previous injuries.  The symptoms seem to start medially around the base of the great toe, however is now more generalized.  This morning it started spreading up the back of her ankle.  Mild additional swelling and warmth.  No fevers.  She denies history of gout.  She reports recent travel to [place] by car over the past couple of weeks.  No recent surgeries.  No history of blood clots.       Home Medications Prior to Admission medications   Medication Sig Start Date End Date Taking? Authorizing Provider  ferrous sulfate 325 (65 FE) MG EC tablet Take 1 tablet by mouth every morning. 12/15/19   [provider]  furosemide (LASIX) 20 MG tablet Take 20 mg by mouth daily as needed. 12/16/19   [provider]  losartan (COZAAR) 50 MG tablet Take 50 mg by mouth 2 (two) times daily. 04/12/20   [provider]  sodium bicarbonate 325 MG tablet Take 325 mg by mouth 2 (two) times daily. 03/05/21   [provider]  Tolvaptan (JYNARQUE) 90 & 30 MG TBPK Take 30 mg by mouth 2 (two) times daily. 90mg  in AM and 30mg  in PM 03/05/21   [provider]  Vitamin D, Ergocalciferol, (DRISDOL) 1.25 MG (50000 UNIT) CAPS capsule Take 50,000 Units by mouth once a week. 02/16/20   [provider]      Allergies    Codeine    Review of Systems   Review of Systems  Physical Exam Updated Vital Signs BP (!) 159/102 (BP Location: Right Arm)   Pulse 78   Temp 98 F (36.7 C) (Oral)   Resp 18   Ht 5'  5" (1.651 m)   Wt 91.2 kg   SpO2 96%   BMI 33.45 kg/m  Physical Exam Vitals and nursing note reviewed.  Constitutional:      Appearance: She is well-developed.  HENT:     Head: Normocephalic and atraumatic.  Eyes:     Pupils: Pupils are equal, round, and reactive to light.  Cardiovascular:     Pulses: Normal pulses. No decreased pulses.  Musculoskeletal:        General: Tenderness present.     Cervical back: Normal range of motion and neck supple.     Left knee: No effusion. Normal range of motion. No tenderness.     Left lower leg: Swelling and tenderness (Distally, posteriorly) present. No bony tenderness.     Left ankle: Swelling present. No tenderness. Normal range of motion.     Left foot: Decreased range of motion. Normal capillary refill. Swelling, tenderness and bony tenderness present. No deformity. Normal pulse (2+ DP).  Skin:    General: Skin is warm and dry.  Neurological:     Mental Status: She is alert.     Sensory: No sensory deficit.     Comments: Motor, sensation, and vascular distal to the injury is fully intact.   Psychiatric:  Mood and Affect: Mood normal.     ED Results / Procedures / Treatments   Labs (all labs ordered are listed, but only abnormal results are displayed) Labs Reviewed - No data to display  EKG None  Radiology US Venous Img Lower  Left (DVT Study) Result Date: 11/16/2023 CLINICAL DATA:  Left lower extremity pain the past 3 days. Recent travel. Evaluate for DVT. EXAM: LEFT LOWER EXTREMITY VENOUS DOPPLER ULTRASOUND TECHNIQUE: Gray-scale sonography with graded compression, as well as color Doppler and duplex ultrasound were performed to evaluate the lower extremity deep venous systems from the level of the common femoral vein and including the common femoral, femoral, profunda femoral, popliteal and calf veins including the posterior tibial, peroneal and gastrocnemius veins when visible. The superficial great saphenous vein was also  interrogated. Spectral Doppler was utilized to evaluate flow at rest and with distal augmentation maneuvers in the common femoral, femoral and popliteal veins. COMPARISON:  None Available. FINDINGS: Contralateral Common Femoral Vein: Respiratory phasicity is normal and symmetric with the symptomatic side. No evidence of thrombus. Normal compressibility. Common Femoral Vein: No evidence of thrombus. Normal compressibility, respiratory phasicity and response to augmentation. Saphenofemoral Junction: No evidence of thrombus. Normal compressibility and flow on color Doppler imaging. Profunda Femoral Vein: No evidence of thrombus. Normal compressibility and flow on color Doppler imaging. Femoral Vein: No evidence of thrombus. Normal compressibility, respiratory phasicity and response to augmentation. Popliteal Vein: No evidence of thrombus. Normal compressibility, respiratory phasicity and response to augmentation. Calf Veins: No evidence of thrombus. Normal compressibility and flow on color Doppler imaging. Superficial Great Saphenous Vein: No evidence of thrombus. Normal compressibility. Other Findings:  None. IMPRESSION: No evidence of DVT within the left lower extremity. Electronically Signed   By: Simonne Come M.D.   On: 11/16/2023 10:59   DG Foot Complete Left Result Date: 11/16/2023 CLINICAL DATA:  Left foot pain and swelling for 3 days without known injury. EXAM: LEFT FOOT - COMPLETE 3+ VIEW COMPARISON:  None Available. FINDINGS: There is no evidence of fracture or dislocation. Mild degenerative changes seen involving first metatarsophalangeal joint. Moderate posterior calcaneal spurring is noted. Soft tissues are unremarkable. IMPRESSION: Chronic findings as noted above.  No acute abnormality seen. Electronically Signed   By: Lupita Raider M.D.   On: 11/16/2023 10:57    Procedures Procedures    Medications Ordered in ED Medications - No data to display  ED Course/ Medical Decision Making/ A&P     Patient seen and examined. History obtained directly from patient.  Reviewed patient's previous labs over the past year to determine baseline renal function.  She was at creatinine of 1.7 in June 2024.  Labs/EKG: None ordered.  Imaging: Ordered x-ray of the left foot, DVT study of the left lower extremity.  Medications/Fluids: None ordered  Most recent vital signs reviewed and are as follows: BP (!) 159/102 (BP Location: Right Arm)   Pulse 78   Temp 98 F (36.7 C) (Oral)   Resp 18   Ht 5\' 5"  (1.651 m)   Wt 91.2 kg   SpO2 96%   BMI 33.45 kg/m   Initial impression: Patient symptoms appear to be most likely an inflammatory process with the left foot.  Question possibility of gout, however symptoms are more widespread than just the MTP joint.  Area does not appear to be cellulitic.  She has normal pulses and sensation distally and I have low concern for ischemia.  Appearance would be atypical and history atypical for  DVT, however given the extension of symptoms into the ankle and recent travel, will rule out DVT with ultrasound.  If workup is reassuring, will likely try several days of prednisone to see if this helps.  She will be given sports medicine follow-up.  11:31 AM Reassessment performed. Patient appears stable, comfortable.  Imaging personally visualized and interpreted including: X-ray, agree negative.  DVT study results reviewed, negative for DVT.  Reviewed pertinent lab work and imaging with patient at bedside. Questions answered.   Most current vital signs reviewed and are as follows: BP 126/75   Pulse 78   Temp 98 F (36.7 C) (Oral)   Resp 18   Ht 5\' 5"  (1.651 m)   Wt 91.2 kg   SpO2 96%   BMI 33.45 kg/m   Plan: Discharge to home.   Prescriptions written for: Prednisone taper, oxycodone # 5 tablets for breakthrough pain  Other home care instructions discussed: Tylenol, avoid NSAIDs due to kidney dysfunction, elevation.  ED return instructions discussed:  Return with worsening pain, swelling, fevers, streaking.  Follow-up instructions discussed: Patient encouraged to follow-up with their PCP in 7 days.  Also given sports medicine follow-up.                                  Medical Decision Making Amount and/or Complexity of Data Reviewed Radiology: ordered.  Risk Prescription drug management.   Patient with left foot pain and swelling.  Appears inflammatory.  Possibly consistent with gout, but patient does not have a history of the same.  It was more in the ankle and lower leg today, DVT study was negative.  X-ray negative.  Patient looks well.  Unfortunately she needs to avoid NSAIDs due to chronic kidney disease.  She is given a small amount of pain medication and a prednisone taper to try.  Outpatient follow-up with PCP/sports medicine.  The patient's vital signs, pertinent lab work and imaging were reviewed and interpreted as discussed in the ED course. Hospitalization was considered for further testing, treatments, or serial exams/observation. However as patient is well-appearing, has a stable exam, and reassuring studies today, I do not feel that they warrant admission at this time. This plan was discussed with the patient who verbalizes agreement and comfort with this plan and seems reliable and able to return to the Emergency Department with worsening or changing symptoms.          Final Clinical Impression(s) / ED Diagnoses Final diagnoses:  Left foot pain    Rx / DC Orders ED Discharge Orders          Ordered    predniSONE (DELTASONE) 20 MG tablet        11/16/23 1129    oxyCODONE (OXY IR/ROXICODONE) 5 MG immediate release tablet  Every 6 hours PRN        11/16/23 1129              Renne Crigler, PA-C 11/16/23 1132    Durwin Glaze, MD 11/16/23 1452

## 2023-11-16 NOTE — Discharge Instructions (Signed)
Please read and follow all provided instructions.  Your diagnoses today include:  1. Left foot pain     Tests performed today include: An x-ray of the affected area - does NOT show any broken bones Ultrasound of your leg does not show sign of blood clot Vital signs. See below for your results today.   Medications prescribed:  Oxycodone - narcotic pain medication  DO NOT drive or perform any activities that require you to be awake and alert because this medicine can make you drowsy.   Prednisone - steroid medicine   It is best to take this medication in the morning to prevent sleeping problems. If you are diabetic, monitor your blood sugar closely and stop taking Prednisone if blood sugar is over 300. Take with food to prevent stomach upset.   Take any prescribed medications only as directed.  Home care instructions:  Follow any educational materials contained in this packet Follow R.I.C.E. Protocol: R - rest your injury  I  - use ice on injury without applying directly to skin C - compress injury with bandage or splint E - elevate the injury as much as possible  Follow-up instructions: Please follow-up with your primary care provider or the provided orthopedic physician (bone specialist) in 1 week. In this case you may have a more severe injury that requires further care.   Return instructions:  Please return if your toes or feet are numb or tingling, appear gray or blue, or you have severe pain (also elevate the leg and loosen splint or wrap if you were given one) Please return to the Emergency Department if you experience worsening symptoms.  Please return if you have any other emergent concerns.  Additional Information:  Your vital signs today were: BP 126/75   Pulse 78   Temp 98 F (36.7 C) (Oral)   Resp 18   Ht 5\' 5"  (1.651 m)   Wt 91.2 kg   SpO2 96%   BMI 33.45 kg/m  If your blood pressure (BP) was elevated above 135/85 this visit, please have this repeated by  your doctor within one month.
